# Patient Record
Sex: Male | Born: 1980
Health system: Southern US, Community
[De-identification: ages and names within clinical notes are randomized; demographics above are authoritative.]

## PROBLEM LIST (undated history)

## (undated) HISTORY — PX: ANTERIOR CRUCIATE LIGAMENT REPAIR: SHX115

---

## 2013-05-04 ENCOUNTER — Encounter (HOSPITAL_COMMUNITY): Payer: Self-pay | Admitting: Emergency Medicine

## 2013-05-04 ENCOUNTER — Emergency Department (HOSPITAL_COMMUNITY)
Admission: EM | Admit: 2013-05-04 | Discharge: 2013-05-04 | Disposition: A | Discharge status: Home / Self Care | Payer: 59 | Source: Home / Self Care | Attending: Emergency Medicine | Admitting: Emergency Medicine

## 2013-05-04 ENCOUNTER — Emergency Department (INDEPENDENT_AMBULATORY_CARE_PROVIDER_SITE_OTHER): Payer: 59

## 2013-05-04 DIAGNOSIS — S8000XA Contusion of unspecified knee, initial encounter: Secondary | ICD-10-CM

## 2013-05-04 DIAGNOSIS — S8001XA Contusion of right knee, initial encounter: Secondary | ICD-10-CM

## 2013-05-04 MED ORDER — HYDROCODONE-ACETAMINOPHEN 5-325 MG PO TABS: 1.0000 | ORAL_TABLET | Freq: Four times a day (QID) | ORAL | Status: AC | PRN

## 2013-05-04 NOTE — ED Notes (Signed)
Pt reports he inj his right knee 1 week ago... Reports he was walking down wooden steps in a new home construction and there was a 4 inch gap where he fell into.  Scraped his knee/leg... Ambulated well to exam room w/NAD.... sxs include: swelling and pain Alert w/no signs of acute distress.

## 2013-05-04 NOTE — ED Provider Notes (Signed)
CSN: [id number]     Arrival date & time 05/04/13  1839 History   First MD Initiated Contact with Patient 05/04/13 2044     Chief Complaint  Patient presents with  . Knee Injury   (Consider location/radiation/quality/duration/timing/severity/associated sxs/prior Treatment) HPI Comments: Patient states while working at his house he accidentally stepped through a gap in a stair and suffered a glancing blow to the medial surface of his right knee. Since the injury he has had pain, swelling and ecchymosis of injured area. Is able to ambulate without assistance. Does not take any anticoagulants.  The history is provided by the patient.    History reviewed. No pertinent past medical history. History reviewed. No pertinent past surgical history. No family history on file. History  Substance Use Topics  . Smoking status: Never Smoker   . Smokeless tobacco: Not on file  . Alcohol Use: Yes    Review of Systems  All other systems reviewed and are negative.    Allergies  Review of patient's allergies indicates no known allergies.  Home Medications   Current Outpatient Rx  Name  Route  Sig  Dispense  Refill  . HYDROcodone-acetaminophen (NORCO/VICODIN) 5-325 MG per tablet   Oral   Take 1-2 tablets by mouth every 6 (six) hours as needed for moderate pain or severe pain.   20 tablet   0    BP 133/64  Pulse 81  Temp(Src) 98.1 F (36.7 C) (Oral)  Resp 16  SpO2 100% Physical Exam  Nursing note and vitals reviewed. Constitutional: He is oriented to person, place, and time. He appears well-developed and well-nourished. No distress.  HENT:  Head: Normocephalic and atraumatic.  Cardiovascular: Normal rate.   Pulmonary/Chest: Effort normal.  Musculoskeletal: He exhibits edema and tenderness.       Right knee: He exhibits swelling and ecchymosis. He exhibits no laceration, no erythema, normal alignment and no bony tenderness.       Legs: Large area of mildly tender fluctuant swelling  with surrounding and dependent ecchymosis most consistent with large hematoma. ROM only limited by swelling. No clinical indications of compartment syndrome of right lower leg.    Neurological: He is alert and oriented to person, place, and time.  Skin: Skin is warm and dry.  Psychiatric: He has a normal mood and affect. His behavior is normal.    ED Course  Procedures (including critical care time) Labs Review Labs Reviewed - No data to display Imaging Review No results found.  EKG Interpretation    Date/Time:    Ventricular Rate:    PR Interval:    QRS Duration:   QT Interval:    QTC Calculation:   R Axis:     Text Interpretation:              MDM  Xrays (to investigate for underlying fracture) negative for fracture. Will refer him to his orthopedist Eulah Pont & [facility]) for further evaluation. Advised ice and elevation with ACE wrap for support. Hematoma of too great a size to be safely drained in Urgent Care setting.    Jess Barters [place], [place] 05/12/13 [phone]

## 2013-05-13 NOTE — ED Provider Notes (Signed)
Medical screening examination/treatment/procedure(s) were performed by non-physician practitioner and as supervising physician I was immediately available for consultation/collaboration.  Leslee Homeavid Sharonda Llamas, M.D.   Reuben Likesavid C Britanny Marksberry, MD 05/13/13 0800

## 2014-07-30 IMAGING — CR DG KNEE COMPLETE 4+V*R*
4 series · 4 of 4 positions shown · non-contrast
Comparison: 01/18/2010 MRI

CLINICAL DATA: Fall downstairs, right knee pain and swelling

EXAM:
RIGHT KNEE - COMPLETE 4+ VIEW

[view not recorded (1 of 4)]
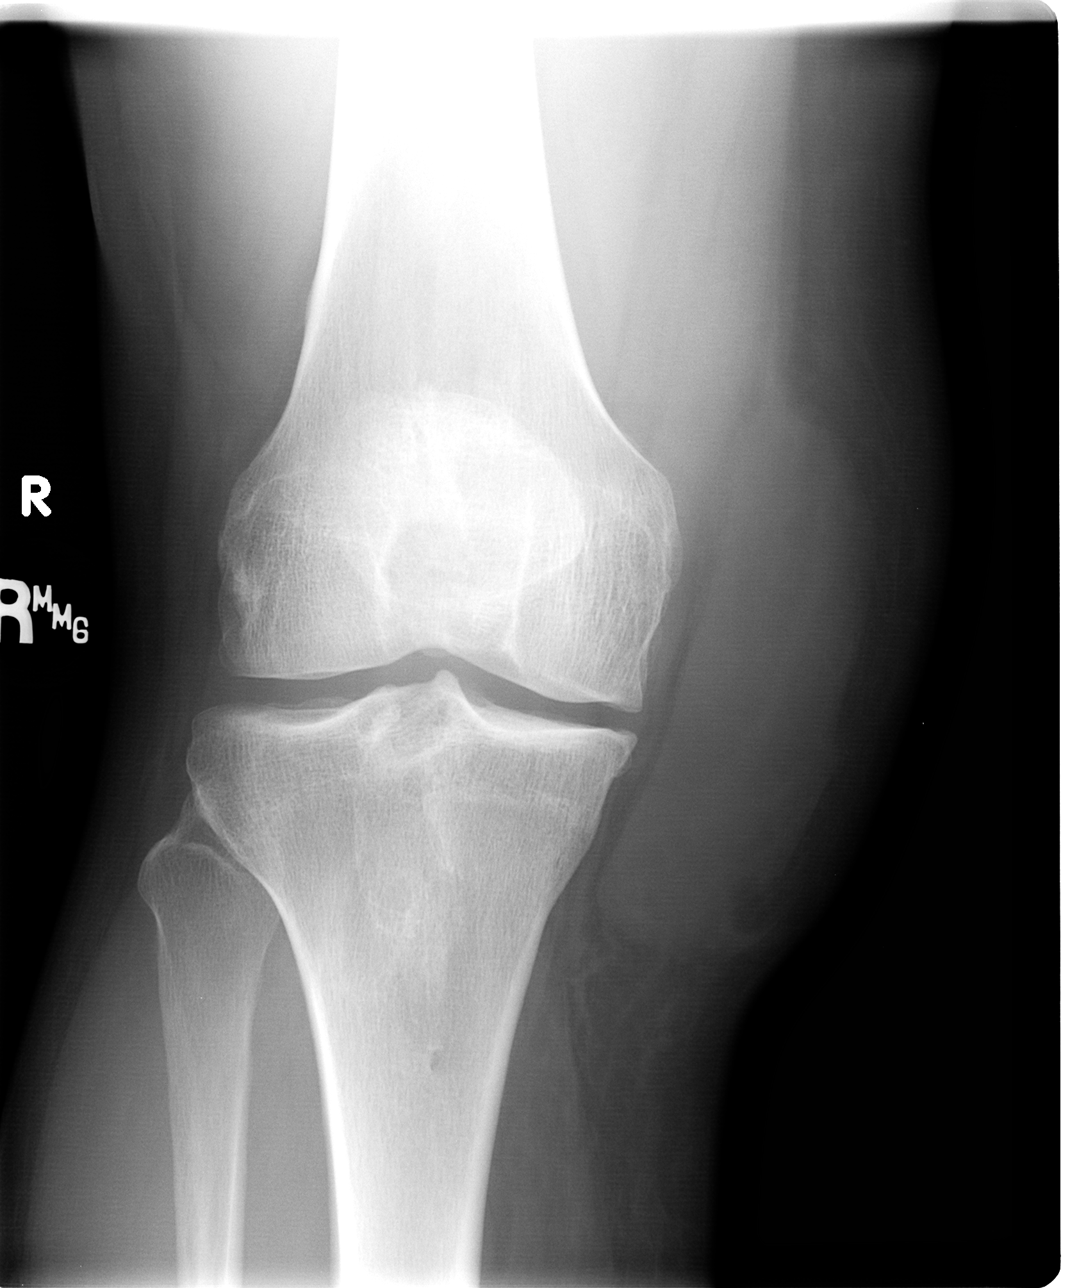

[view not recorded (2 of 4)]
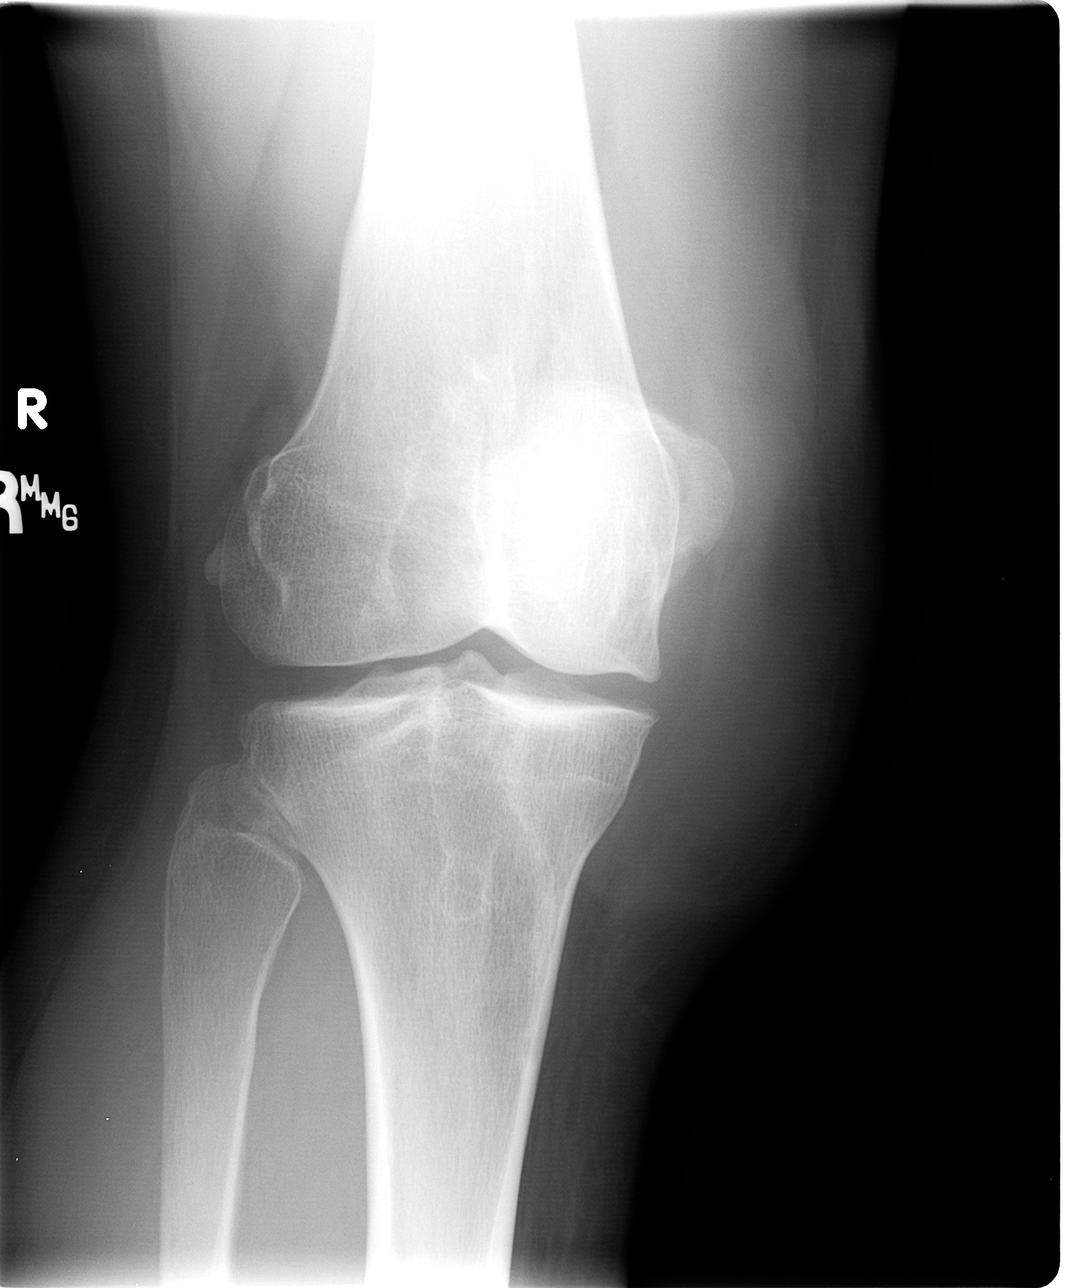

[view not recorded (3 of 4)]
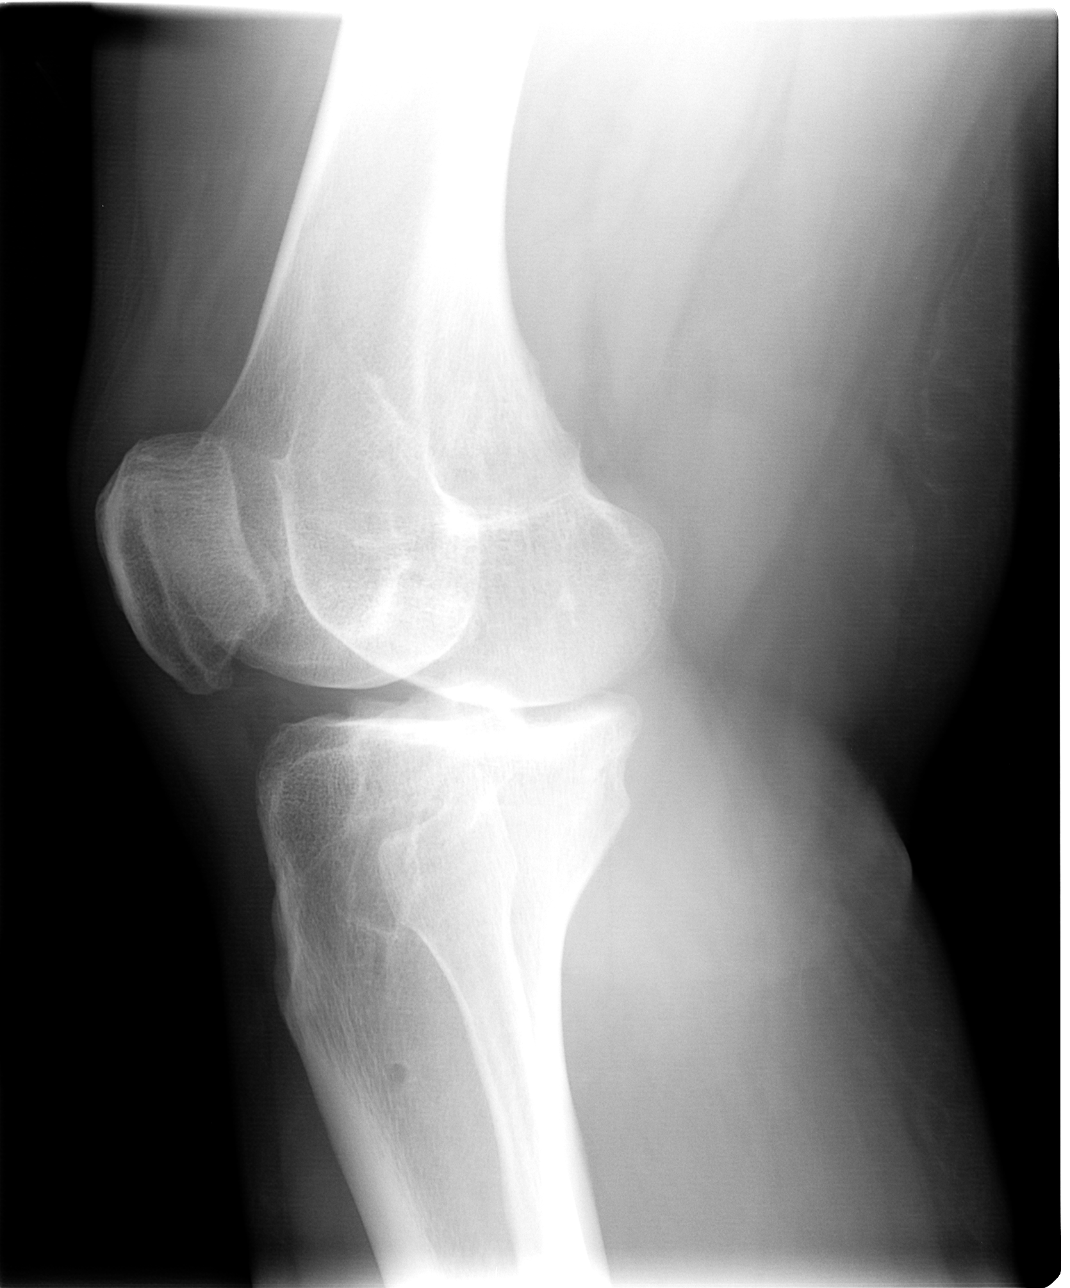

[view not recorded (4 of 4)]
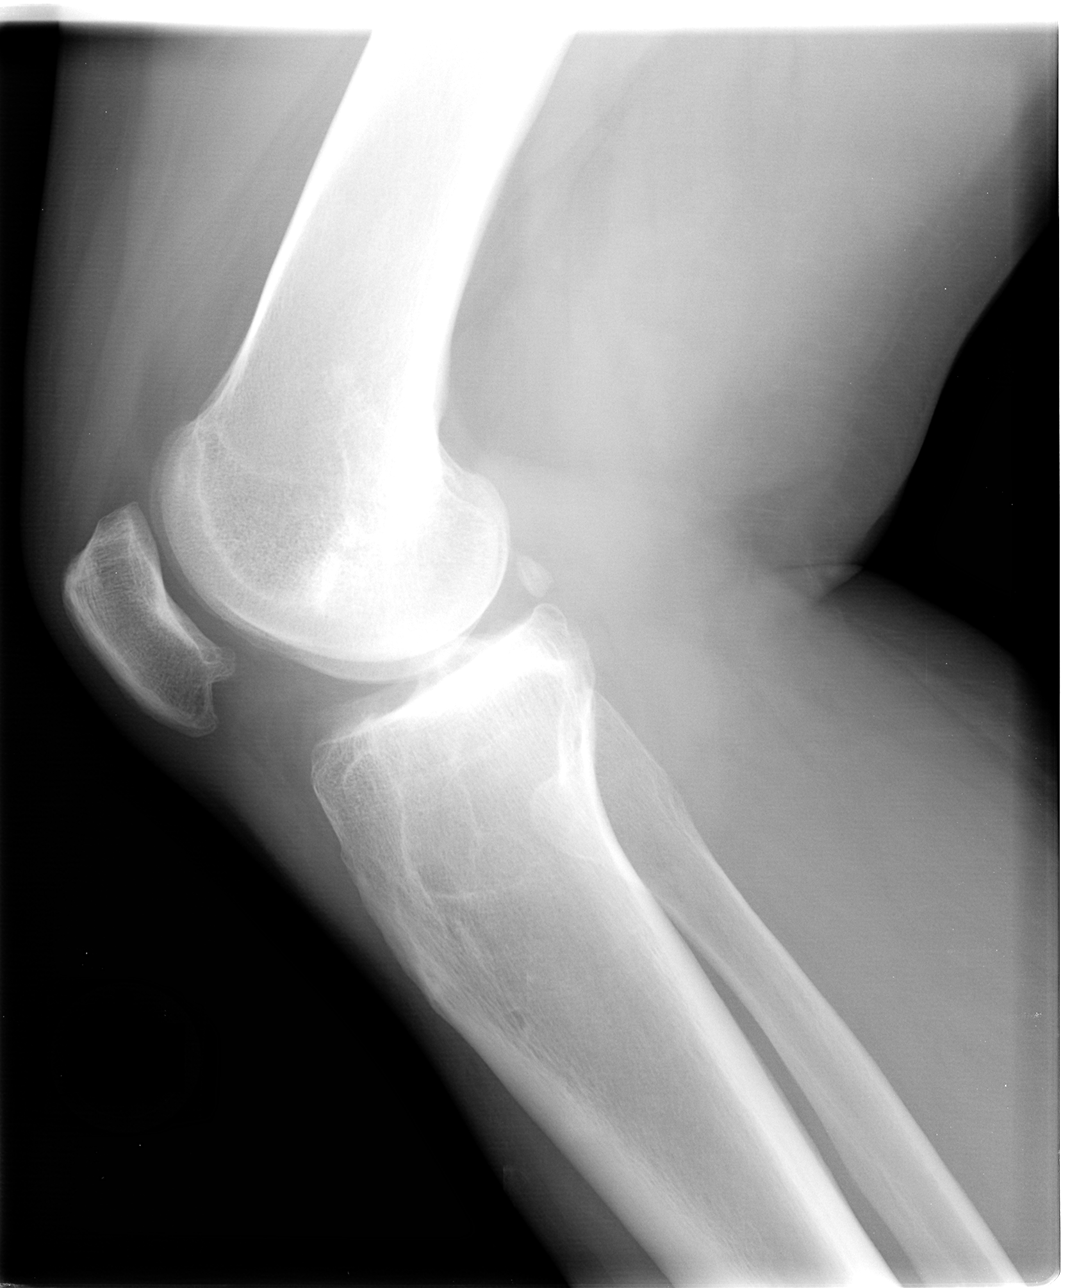

[4 of 4 positions shown; findings below may reference images not displayed]

FINDINGS: Medial right knee subcutaneous swelling noted compatible with soft
tissue hematoma. Right knee tricompartmental mild osteoarthritis
noted, most pronounced in the medial compartment. Postop changes
noted of the knee, suspect prior ACL reconstruction. No acute
fracture, malalignment or effusion.
IMPRESSION: Medial right knee subcutaneous bruising/ hematoma.

Mild osteoarthritis

No acute osseous finding

## 2016-11-14 ENCOUNTER — Ambulatory Visit: Payer: Self-pay | Admitting: Sports Medicine

## 2018-08-30 ENCOUNTER — Other Ambulatory Visit: Payer: Self-pay

## 2018-08-30 ENCOUNTER — Encounter (HOSPITAL_COMMUNITY): Payer: Self-pay

## 2018-08-30 ENCOUNTER — Ambulatory Visit (HOSPITAL_COMMUNITY)
Admission: EM | Admit: 2018-08-30 | Discharge: 2018-08-30 | Disposition: A | Discharge status: Home / Self Care | Payer: No Typology Code available for payment source | Attending: Internal Medicine | Admitting: Internal Medicine

## 2018-08-30 DIAGNOSIS — W312XXA Contact with powered woodworking and forming machines, initial encounter: Secondary | ICD-10-CM | POA: Diagnosis not present

## 2018-08-30 DIAGNOSIS — Z23 Encounter for immunization: Secondary | ICD-10-CM

## 2018-08-30 DIAGNOSIS — S61210A Laceration without foreign body of right index finger without damage to nail, initial encounter: Secondary | ICD-10-CM

## 2018-08-30 MED ORDER — TETANUS-DIPHTH-ACELL PERTUSSIS 5-2.5-18.5 LF-MCG/0.5 IM SUSP
0.5000 mL | Freq: Once | INTRAMUSCULAR | Status: AC
  Administered 2018-08-30: 19:00:00 0.5 mL via INTRAMUSCULAR

## 2018-08-30 MED ORDER — TETANUS-DIPHTH-ACELL PERTUSSIS 5-2.5-18.5 LF-MCG/0.5 IM SUSP
INTRAMUSCULAR | Status: AC
  Filled 2018-08-30: qty 0.5

## 2018-08-30 NOTE — ED Provider Notes (Signed)
[facility]    CSN: [id number] Arrival date & time: 08/30/18  1823     History   Chief Complaint Chief Complaint  Patient presents with  . Laceration    HPI Gerald MayoDavid Bryant is a 38 y.o. male no significant past medical history presenting today for evaluation of finger laceration.  Patient sustained wound to his right index finger while using a router saw her earlier today.  This happened around 2 PM.  He cleaned it well with soap, water and peroxide.  He is mainly presenting today as he was concerned about tetanus shot.  He is unsure when his last tetanus was.  He denies any difficulty bending finger.  Denies numbness or tingling.  HPI  History reviewed. No pertinent past medical history.  There are no active problems to display for this patient.   Past Surgical History:  Procedure Laterality Date  . ANTERIOR CRUCIATE LIGAMENT REPAIR Right        Home Medications    Prior to Admission medications   Medication Sig Start Date End Date Taking? Authorizing Provider  HYDROcodone-acetaminophen (NORCO/VICODIN) 5-325 MG per tablet Take 1-2 tablets by mouth every 6 (six) hours as needed for moderate pain or severe pain. 05/04/13   Presson, Mathis FareJennifer Lee H, PA    Family History History reviewed. No pertinent family history.  Social History Social History   Tobacco Use  . Smoking status: Never Smoker  . Smokeless tobacco: Never Used  Substance Use Topics  . Alcohol use: Yes  . Drug use: No     Allergies   Patient has no known allergies.   Review of Systems Review of Systems  Constitutional: Negative for fatigue and fever.  Eyes: Negative for redness, itching and visual disturbance.  Respiratory: Negative for shortness of breath.   Cardiovascular: Negative for chest pain and leg swelling.  Gastrointestinal: Negative for nausea and vomiting.  Musculoskeletal: Negative for arthralgias and myalgias.  Skin: Positive for color change and wound. Negative  for rash.  Neurological: Negative for dizziness, syncope, weakness, light-headedness and headaches.     Physical Exam Triage Vital Signs ED Triage Vitals  Enc Vitals Group     BP 08/30/18 1836 128/88     Pulse Rate 08/30/18 1836 84     Resp 08/30/18 1836 18     Temp 08/30/18 1836 97.9 F (36.6 C)     Temp src --      SpO2 08/30/18 1836 96 %     Weight --      Height --      Head Circumference --      Peak Flow --      Pain Score 08/30/18 1838 2     Pain Loc --      Pain Edu? --      Excl. in GC? --    No data found.  Updated Vital Signs BP 128/88   Pulse 84   Temp 97.9 F (36.6 C)   Resp 18   SpO2 96%   Visual Acuity Right Eye Distance:   Left Eye Distance:   Bilateral Distance:    Right Eye Near:   Left Eye Near:    Bilateral Near:     Physical Exam Vitals signs and nursing note reviewed.  Constitutional:      Appearance: He is well-developed.     Comments: No acute distress  HENT:     Head: Normocephalic and atraumatic.     Nose: Nose normal.  Eyes:  Conjunctiva/sclera: Conjunctivae normal.  Neck:     Musculoskeletal: Neck supple.  Cardiovascular:     Rate and Rhythm: Normal rate.  Pulmonary:     Effort: Pulmonary effort is normal. No respiratory distress.  Abdominal:     General: There is no distension.  Musculoskeletal: Normal range of motion.     Comments: Right index finger with superficial slightly jagged 1 cm circular wound to lateral aspect of distal finger.  No involvement of nail bed.  Bleeding well controlled.  Full active range of motion of finger, cap refill brisk.  Skin:    General: Skin is warm and dry.  Neurological:     Mental Status: He is alert and oriented to person, place, and time.      UC Treatments / Results  Labs (all labs ordered are listed, but only abnormal results are displayed) Labs Reviewed - No data to display  EKG None  Radiology No results found.  Procedures Procedures (including critical care  time)  Medications Ordered in UC Medications  Tdap (BOOSTRIX) injection 0.5 mL (0.5 mLs Intramuscular Given 08/30/18 1852)    Initial Impression / Assessment and Plan / UC Course  I have reviewed the triage vital signs and the nursing notes.  Pertinent labs & imaging results that were available during my care of the patient were reviewed by me and considered in my medical decision making (see chart for details).     Jagged skin removed as likely would fall off in future.  Cleanse wound with warm soapy water.  Irrigated with 150 cc sterile water.  Dermabond applied.  Discussed wound care.  Updated tetanus.  Monitor for development of signs of infection.Discussed strict return precautions. Patient verbalized understanding and is agreeable with plan.  Final Clinical Impressions(s) / UC Diagnoses   Final diagnoses:  Laceration of right index finger without foreign body without damage to nail, initial encounter     Discharge Instructions     WOUND CARE . Keep area clean and dry for 24 hours. Do not remove bandage, if applied. . Do not apply any ointments or creams to the wound while stitches/staples are in place, as this may cause delayed healing. . Notify the office if you experience any of the following signs of infection: Swelling, redness, pus drainage, streaking, fever >101.0 F . Notify the office if you experience excessive bleeding that does not stop after 15-20 minutes of constant, firm pressure.   ED Prescriptions    None     Controlled Substance Prescriptions [place] Controlled Substance Registry consulted? Not Applicable   Lew Dawes, [place] 08/30/18 2006

## 2018-08-30 NOTE — ED Triage Notes (Signed)
Finger laceration to right hand using router saw

## 2018-08-30 NOTE — Discharge Instructions (Signed)
WOUND CARE  Keep area clean and dry for 24 hours. Do not remove bandage, if applied.  Do not apply any ointments or creams to the wound while stitches/staples are in place, as this may cause delayed healing.  Notify the office if you experience any of the following signs of infection: Swelling, redness, pus drainage, streaking, fever >101.0 F  Notify the office if you experience excessive bleeding that does not stop after 15-20 minutes of constant, firm pressure.

## 2018-08-31 ENCOUNTER — Ambulatory Visit (HOSPITAL_COMMUNITY)
Admission: EM | Admit: 2018-08-31 | Discharge: 2018-08-31 | Disposition: A | Discharge status: Home / Self Care | Payer: No Typology Code available for payment source

## 2018-08-31 NOTE — ED Notes (Signed)
Patient not meant to be checked in.  Will d/c.
